# Patient Record
Sex: Male | Born: 1985 | Race: White | Hispanic: No | Marital: Single
Health system: Southern US, Community
[De-identification: ages and names within clinical notes are randomized; demographics above are authoritative.]

---
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
## 2007-07-10 IMAGING — CR DG CHEST 2V
2 series · 2 of 2 positions shown · non-contrast
Comparison: None.

CLINICAL DATA: Cough. 
 CHEST ? 2 VIEW:

[w chest pa]
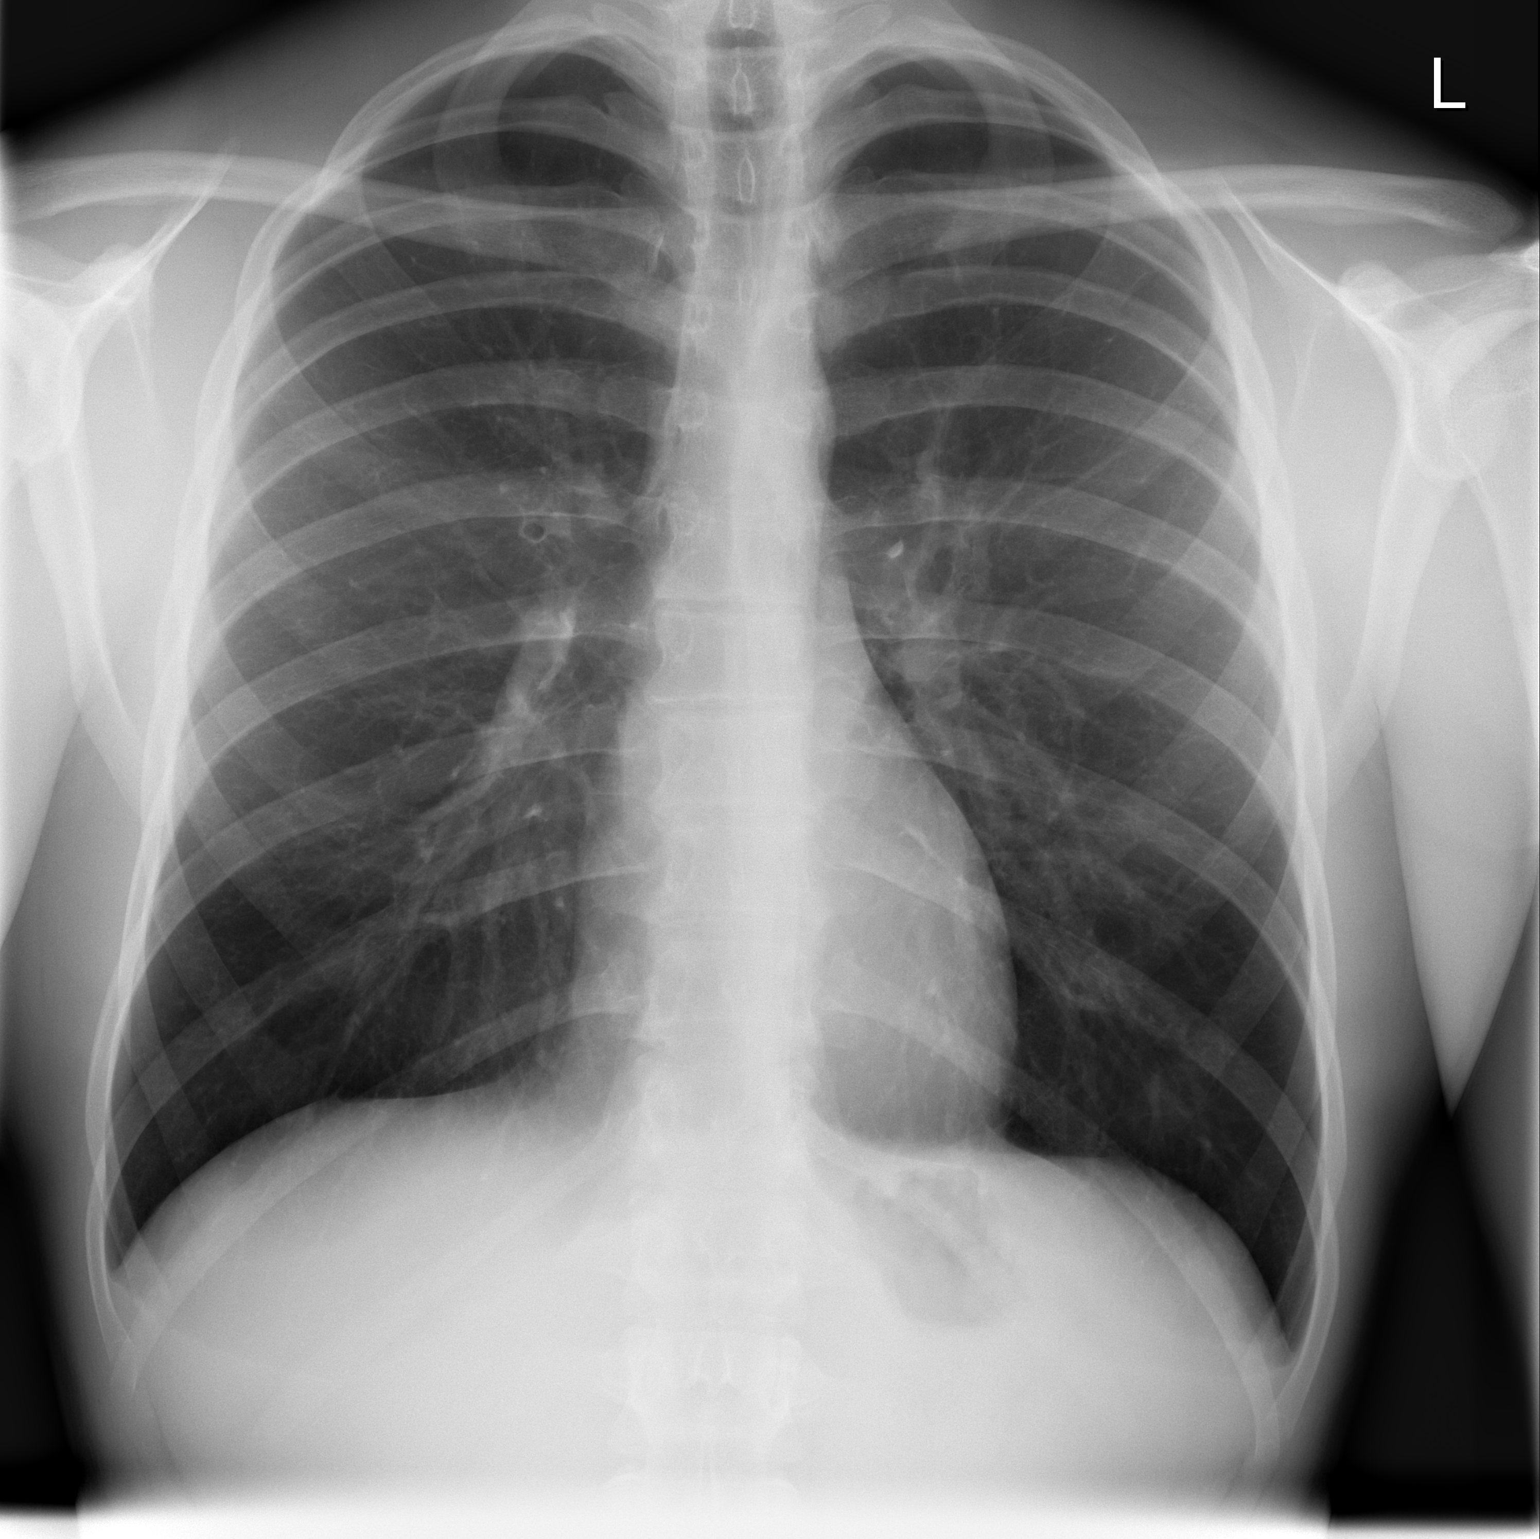

[w chest lat]
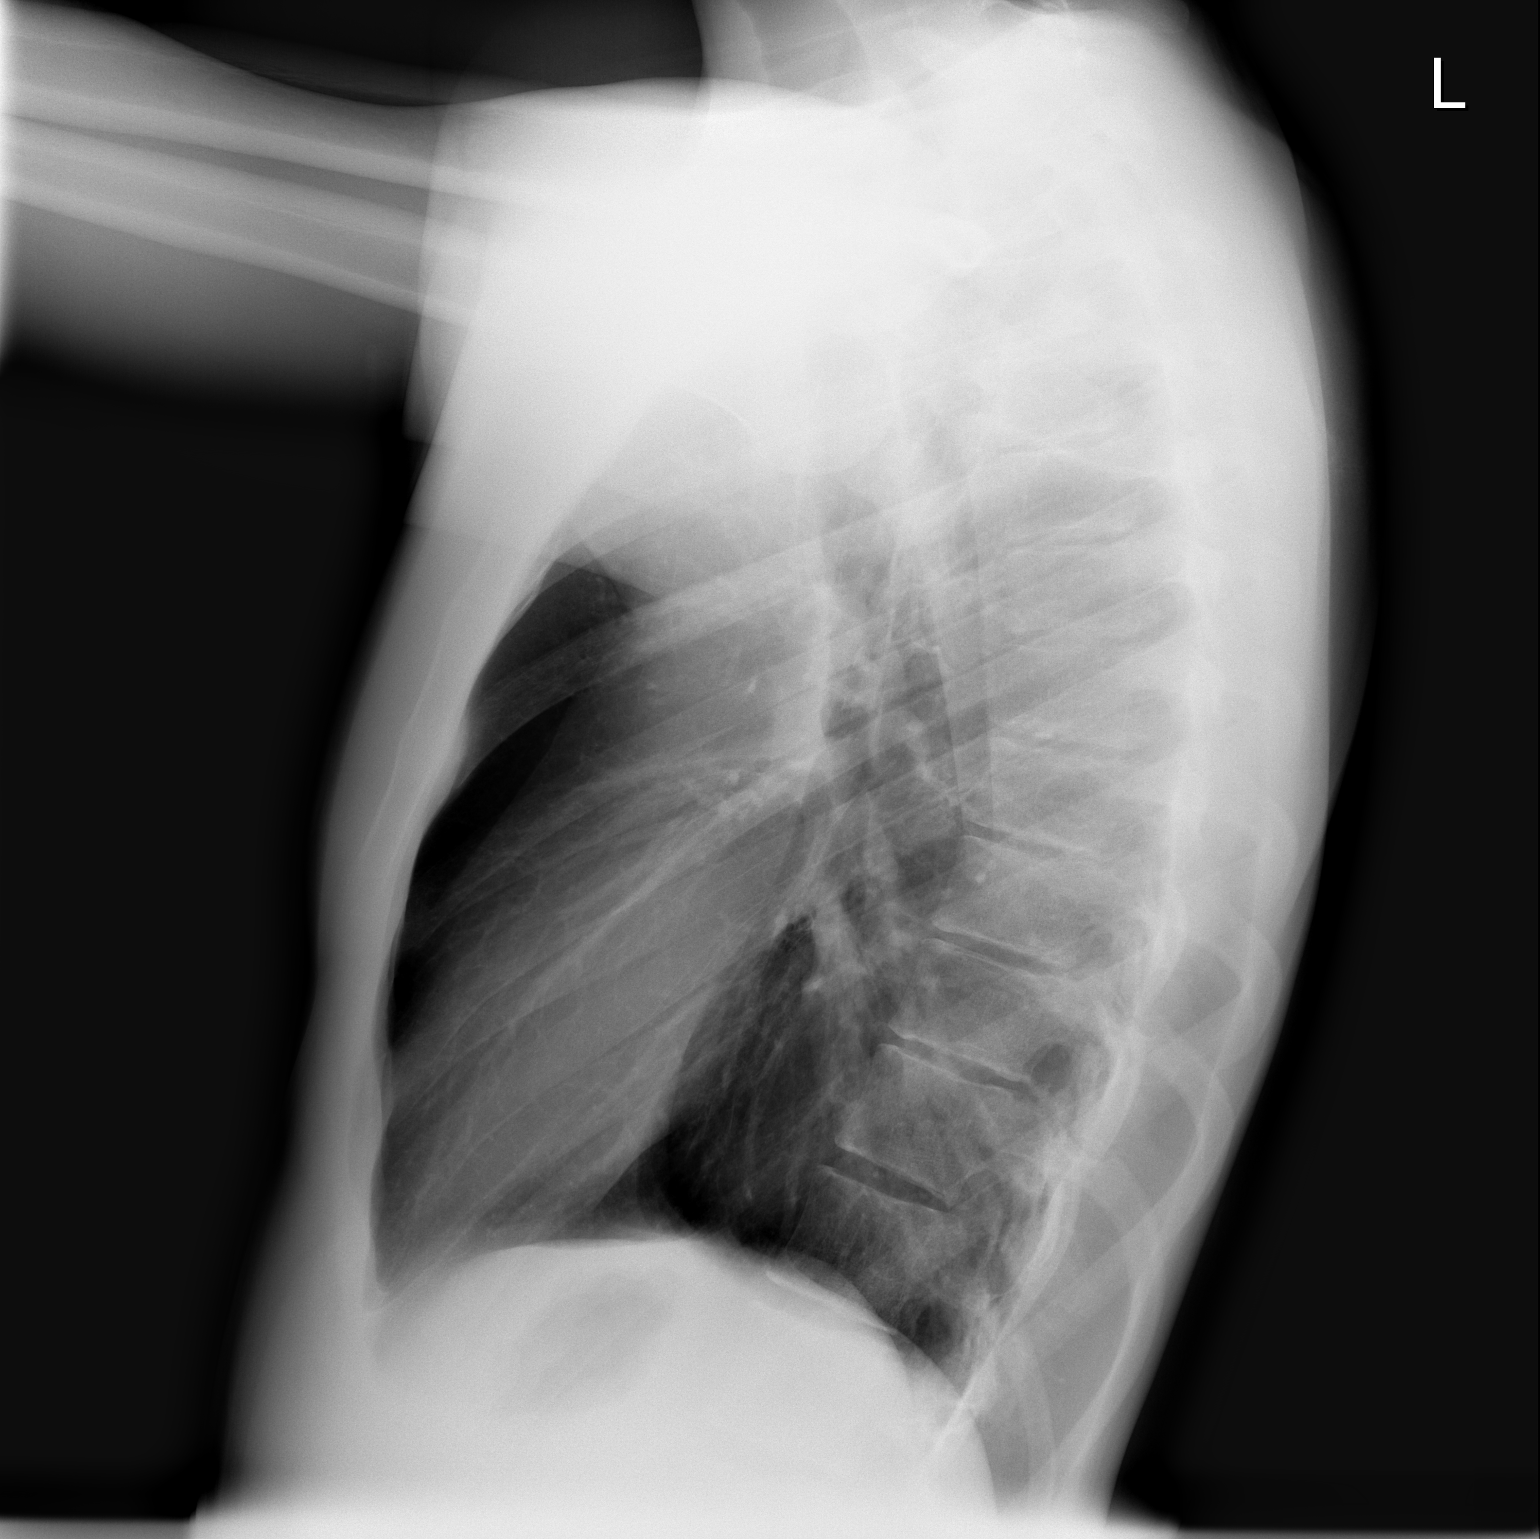

[2 of 2 positions shown; findings below may reference images not displayed]

FINDINGS: Heart size normal.  No effusions or edema.  There are airspace opacities noted.
IMPRESSION: No active cardiopulmonary disease.

## 2019-12-03 ENCOUNTER — Telehealth: Payer: Self-pay | Admitting: Cardiology

## 2019-12-03 NOTE — Telephone Encounter (Signed)
I understand where he is coming from but it has been so long since I have seen him and I no longer have a relationship that I think he do best to go to his primary care physician down there and they can order it.

## 2019-12-03 NOTE — Telephone Encounter (Addendum)
Left message for the pt to call back. NO records in the chart.

## 2019-12-03 NOTE — Telephone Encounter (Signed)
Pt says he saw Dr. Dulce Sellar years ago when he was 34 years old and was diagnosed with Mitral valve prolapse and was advised he should have an Echo ervy 2 years or so... he has not had any follow up since and now he lives in [place] and he has not had any problems he was just thinking about ut when he went hiking and he was at a very high altitude and was mildly Sob as was the other hikers he was with but it did trigger him to think about having an Echo since it had been so long.   He is asking for Dr. Dulce Sellar to order it.... he spoke with the Cardio Dept at [facility] in [place]...they advised him he just needs any Cardiologist to order it and he can have it there.   I advised him that it has been so long we do not have record of his cardio history with Dr. Dulce Sellar and at his age he would need to have a local cardiologist follow him and possibly order his Echo.   He asked if I could just ask Dr. Dulce Sellar if he would possibly order this time. I told him I would at least forward his message to him and his nurse for review and will call him back.   If Dr. Dulce Sellar wants to order the Echo   [facility] [phone]  Fax [phone].

## 2019-12-03 NOTE — Telephone Encounter (Signed)
Pt advised and will talk with a local MD in [place].

## 2019-12-03 NOTE — Telephone Encounter (Signed)
Pt called and stated that Dr. Dulce Sellar told him some years back that he should have an Echo done. Pt states that he has now moved to [facility] and wants to know if Dr. Dulce Sellar would send in a request to [facility] in [place] to have an Echo done. Please call to discuss

## 2019-12-03 NOTE — Telephone Encounter (Signed)
Pt is returning phone call ° °
